# Patient Record
Sex: Male | Born: 1935 | Race: White | Hispanic: No | Marital: Married | Smoking: Former smoker
Health system: Southern US, Community
[De-identification: ages and names within clinical notes are randomized; demographics above are authoritative.]

## PROBLEM LIST (undated history)

## (undated) DIAGNOSIS — I10 Essential (primary) hypertension: Secondary | ICD-10-CM

## (undated) DIAGNOSIS — I4891 Unspecified atrial fibrillation: Secondary | ICD-10-CM

## (undated) DIAGNOSIS — M109 Gout, unspecified: Secondary | ICD-10-CM

## (undated) HISTORY — PX: CHOLECYSTECTOMY: SHX55

---

## 2021-07-08 ENCOUNTER — Other Ambulatory Visit: Payer: Self-pay

## 2021-07-08 ENCOUNTER — Encounter (HOSPITAL_COMMUNITY): Payer: Self-pay | Admitting: Emergency Medicine

## 2021-07-08 ENCOUNTER — Emergency Department (HOSPITAL_COMMUNITY)
Admission: EM | Admit: 2021-07-08 | Discharge: 2021-07-09 | Discharge status: Against Medical Advice | Payer: Medicare Other | Attending: Physician Assistant | Admitting: Physician Assistant

## 2021-07-08 DIAGNOSIS — Z5321 Procedure and treatment not carried out due to patient leaving prior to being seen by health care provider: Secondary | ICD-10-CM | POA: Diagnosis not present

## 2021-07-08 DIAGNOSIS — M25522 Pain in left elbow: Secondary | ICD-10-CM | POA: Diagnosis present

## 2021-07-08 HISTORY — DX: Unspecified atrial fibrillation: I48.91

## 2021-07-08 HISTORY — DX: Gout, unspecified: M10.9

## 2021-07-08 HISTORY — DX: Essential (primary) hypertension: I10
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
## 2021-07-09 ENCOUNTER — Emergency Department (HOSPITAL_COMMUNITY): Payer: Medicare Other

## 2021-07-09 DIAGNOSIS — M25522 Pain in left elbow: Secondary | ICD-10-CM | POA: Diagnosis not present

## 2021-07-09 LAB — BASIC METABOLIC PANEL
Anion gap: 9 (ref 5–15)
BUN: 12 mg/dL (ref 8–23)
CO2: 24 mmol/L (ref 22–32)
Calcium: 9.3 mg/dL (ref 8.9–10.3)
Chloride: 106 mmol/L (ref 98–111)
Creatinine, Ser: 1.32 mg/dL — ABNORMAL HIGH (ref 0.61–1.24)
GFR, Estimated: 53 mL/min — ABNORMAL LOW (ref >60)
Glucose, Bld: 94 mg/dL (ref 70–99)
Potassium: 3.9 mmol/L (ref 3.5–5.1)
Sodium: 139 mmol/L (ref 135–145)

## 2021-07-09 LAB — CBC WITH DIFFERENTIAL/PLATELET
Abs Immature Granulocytes: 0.04 10*3/uL (ref 0.00–0.07)
Basophils Absolute: 0.1 10*3/uL (ref 0.0–0.1)
Basophils Relative: 1 %
Eosinophils Absolute: 0.4 10*3/uL (ref 0.0–0.5)
Eosinophils Relative: 4 %
HCT: 41.2 % (ref 39.0–52.0)
Hemoglobin: 14.1 g/dL (ref 13.0–17.0)
Immature Granulocytes: 1 %
Lymphocytes Relative: 20 %
Lymphs Abs: 1.6 10*3/uL (ref 0.7–4.0)
MCH: 34.6 pg — ABNORMAL HIGH (ref 26.0–34.0)
MCHC: 34.2 g/dL (ref 30.0–36.0)
MCV: 101.2 fL — ABNORMAL HIGH (ref 80.0–100.0)
Monocytes Absolute: 0.9 10*3/uL (ref 0.1–1.0)
Monocytes Relative: 11 %
Neutro Abs: 5.3 10*3/uL (ref 1.7–7.7)
Neutrophils Relative %: 63 %
Platelets: 215 10*3/uL (ref 150–400)
RBC: 4.07 MIL/uL — ABNORMAL LOW (ref 4.22–5.81)
RDW: 12.9 % (ref 11.5–15.5)
WBC: 8.2 10*3/uL (ref 4.0–10.5)
nRBC: 0 % (ref 0.0–0.2)

## 2021-07-09 LAB — LACTIC ACID, PLASMA: Lactic Acid, Venous: 1.9 mmol/L (ref 0.5–1.9)

## 2021-07-09 NOTE — ED Notes (Signed)
Pt left AMA °

## 2022-10-23 IMAGING — DX DG ELBOW COMPLETE 3+V*L*
4 series · 4 of 4 positions shown · non-contrast
Comparison: None.

CLINICAL DATA: Left elbow pain

EXAM:
LEFT ELBOW - COMPLETE 3+ VIEW

[x elbow lat left]
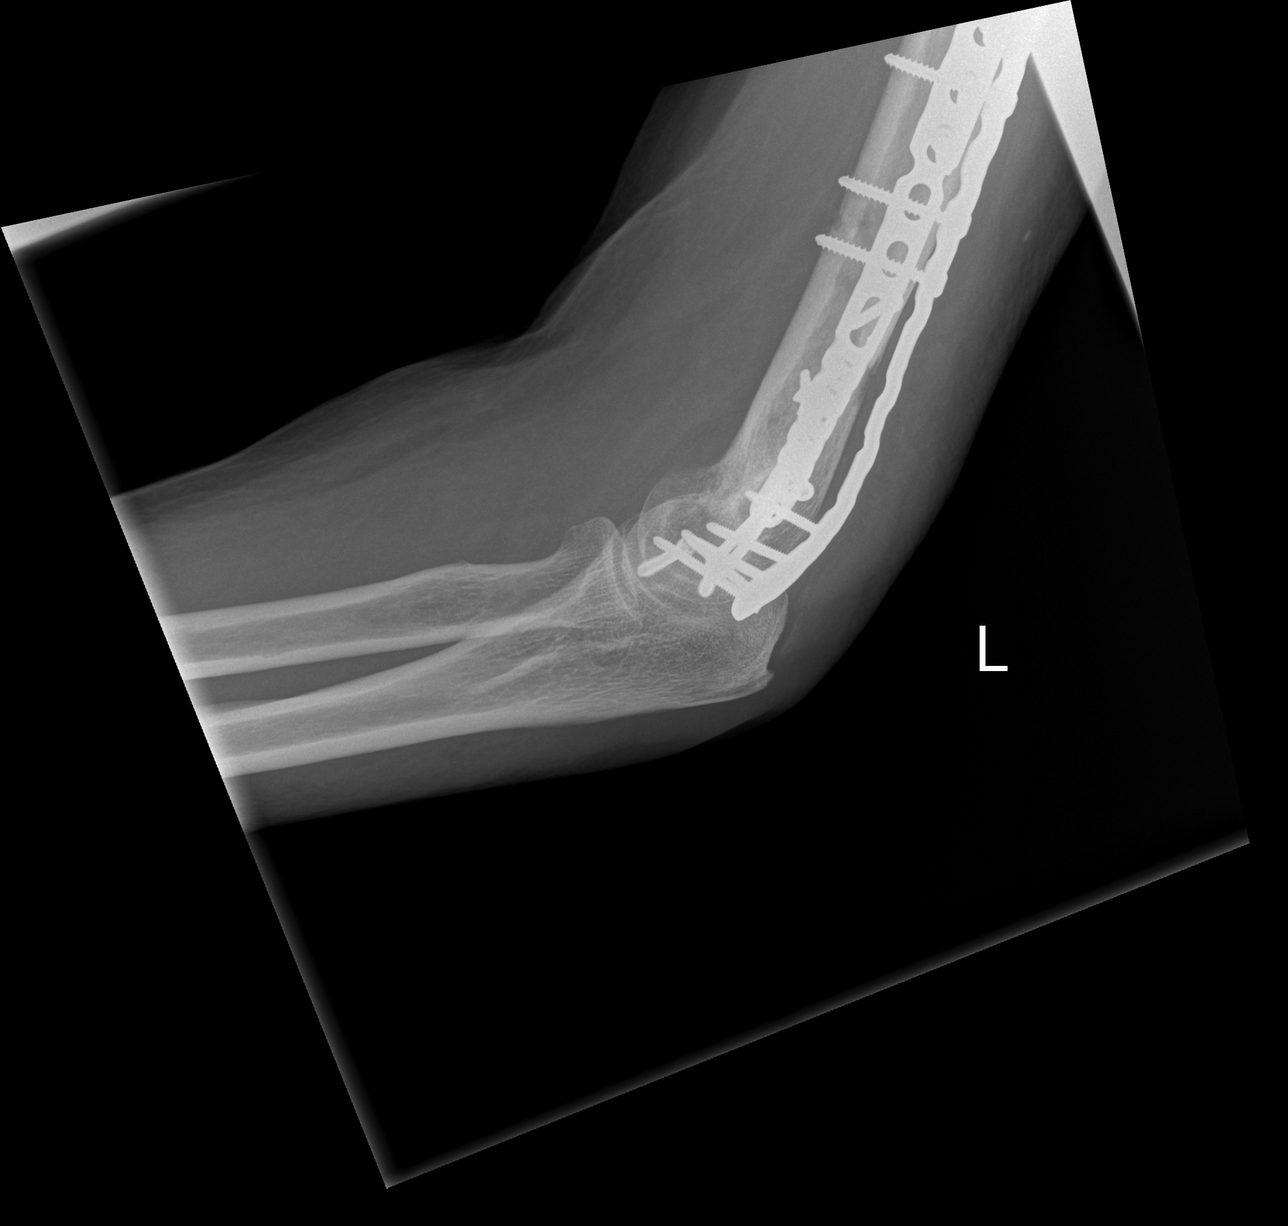

[x elbow ap left]
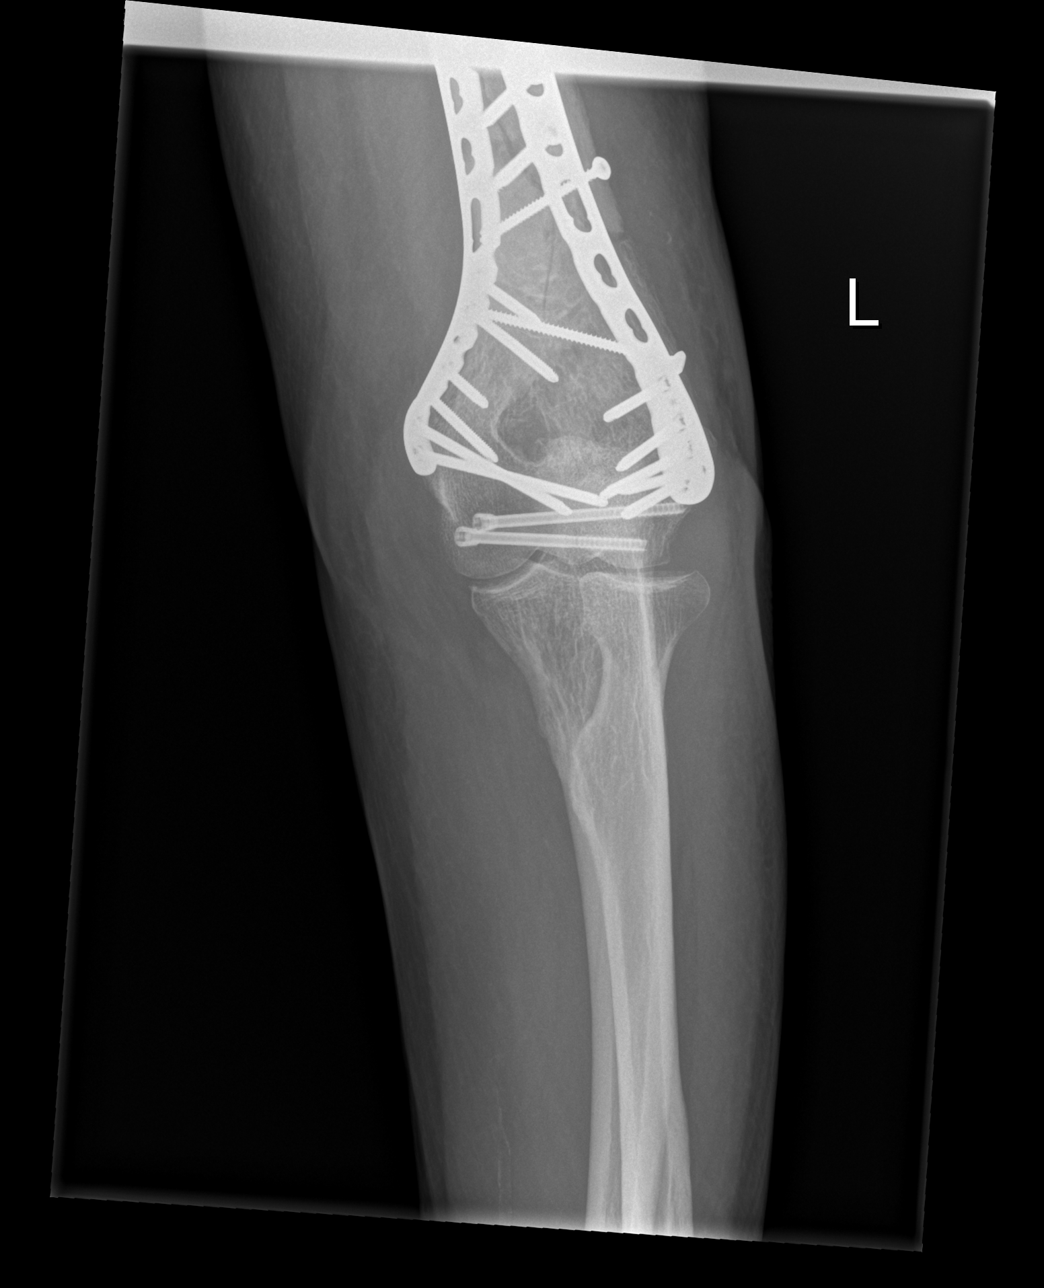

[x elbow obl left (1 of 2)]
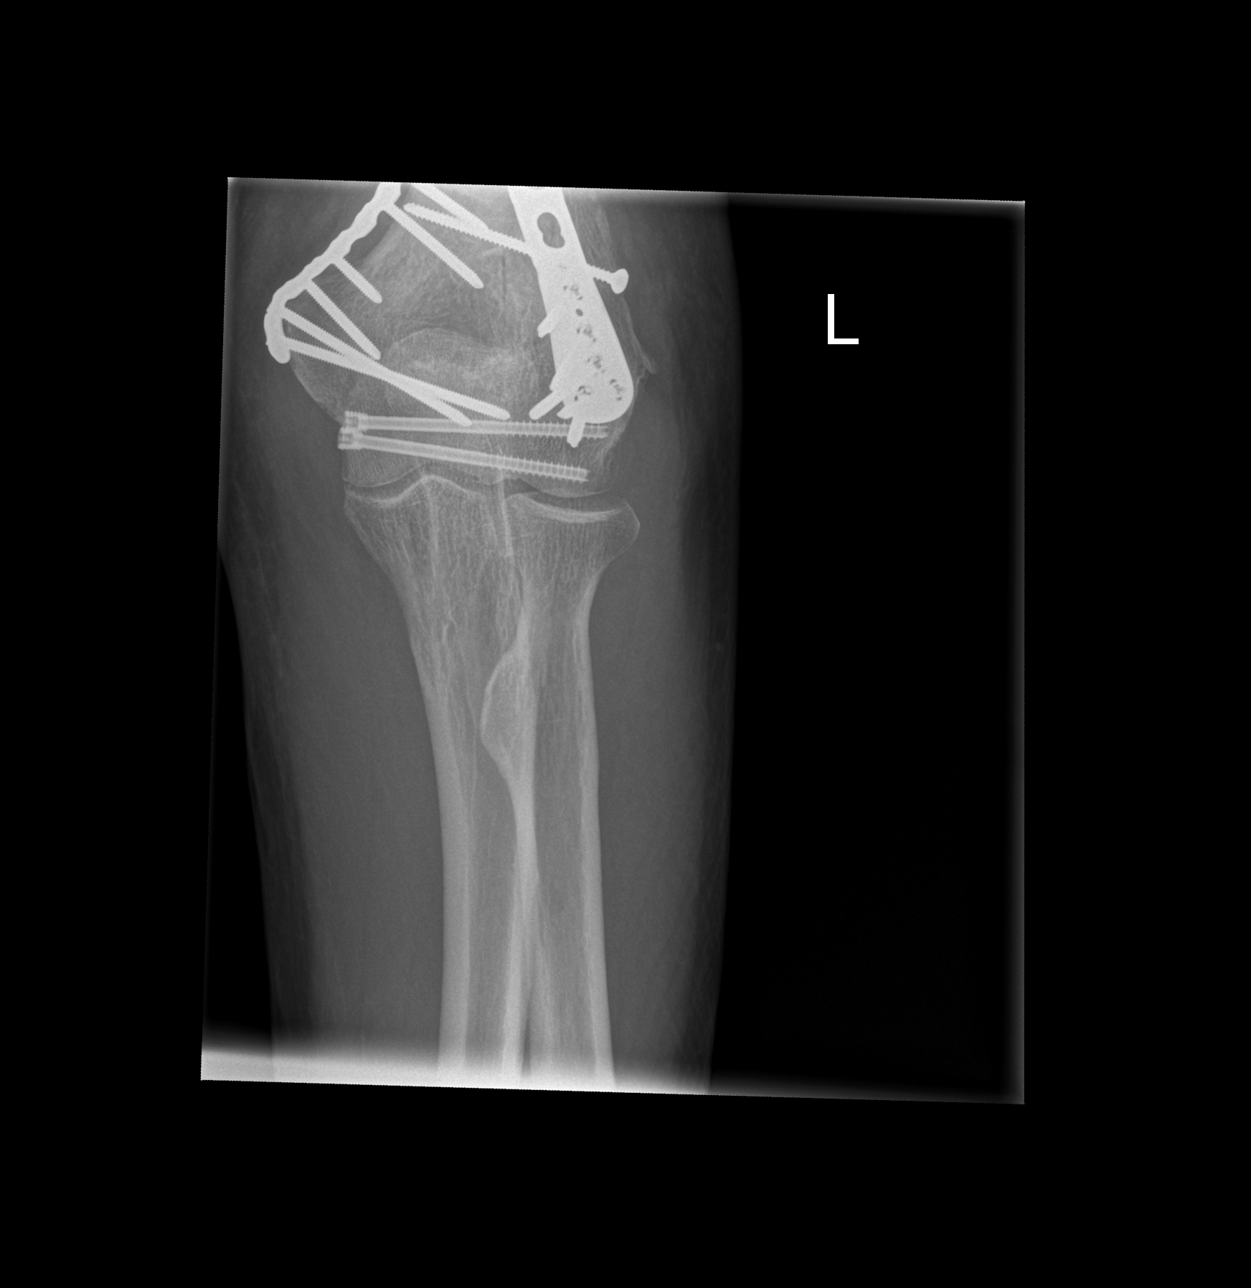

[x elbow obl left (2 of 2)]
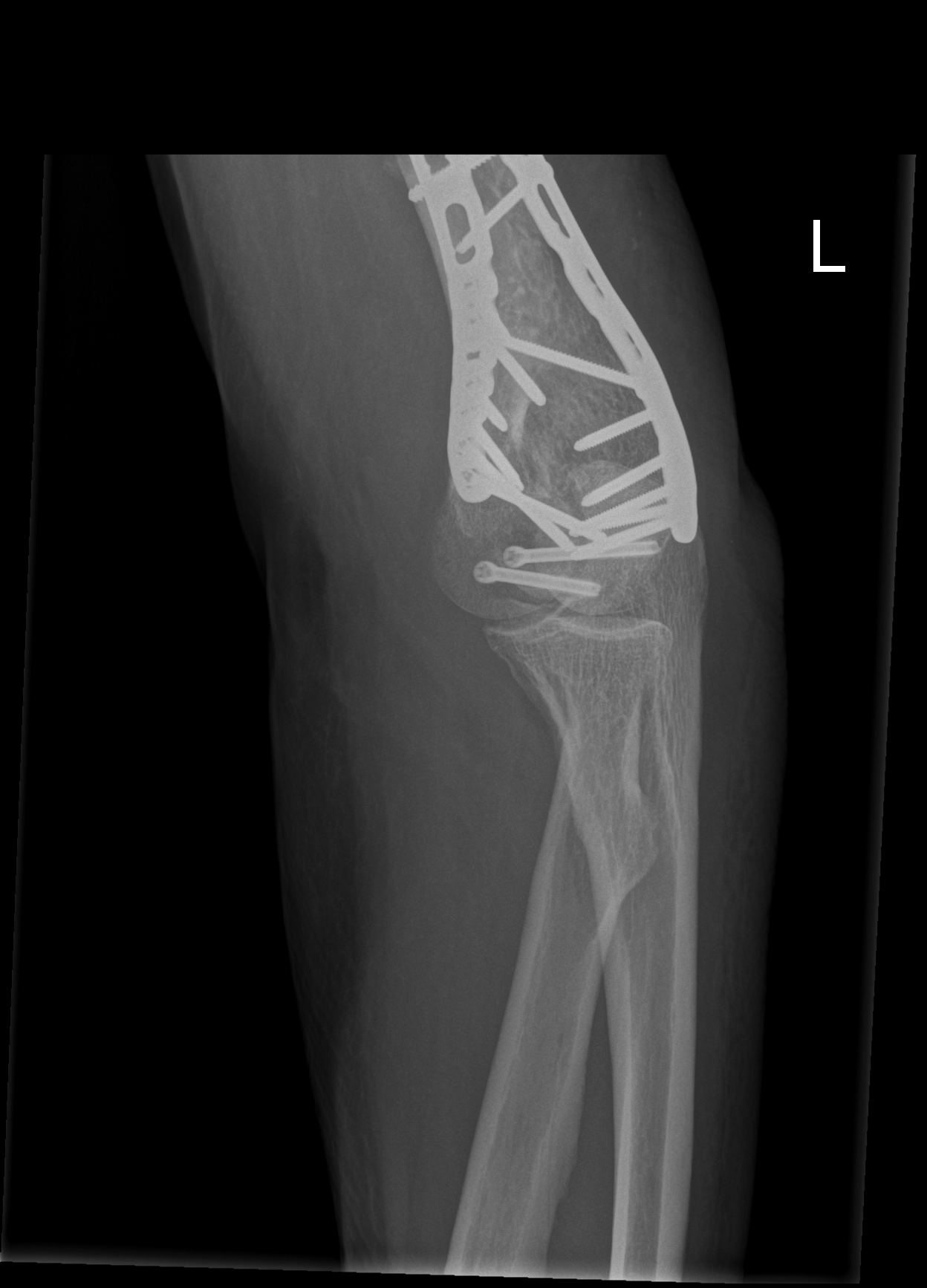

[4 of 4 positions shown; findings below may reference images not displayed]

FINDINGS: ORIF distal left humerus is partially visualized. Fracture fragments
are in anatomic alignment. No acute fracture or dislocation. No
effusion. Mild diffuse surrounding soft tissue swelling.
IMPRESSION: Soft tissue swelling.  No acute fracture or dislocation.
